# Patient Record
Sex: Female | Born: 1935 | Race: White | Hispanic: No | Smoking: Never smoker
Health system: Southern US, Community
[De-identification: ages and names within clinical notes are randomized; demographics above are authoritative.]

## PROBLEM LIST (undated history)

## (undated) DIAGNOSIS — I2510 Atherosclerotic heart disease of native coronary artery without angina pectoris: Secondary | ICD-10-CM

## (undated) DIAGNOSIS — I10 Essential (primary) hypertension: Secondary | ICD-10-CM

## (undated) DIAGNOSIS — G4730 Sleep apnea, unspecified: Secondary | ICD-10-CM

## (undated) DIAGNOSIS — I509 Heart failure, unspecified: Secondary | ICD-10-CM

---

## 2009-01-09 ENCOUNTER — Emergency Department (HOSPITAL_BASED_OUTPATIENT_CLINIC_OR_DEPARTMENT_OTHER): Admission: EM | Admit: 2009-01-09 | Discharge: 2009-01-09 | Payer: Self-pay | Admitting: Emergency Medicine

## 2014-09-11 ENCOUNTER — Emergency Department (HOSPITAL_BASED_OUTPATIENT_CLINIC_OR_DEPARTMENT_OTHER): Payer: Medicare Other

## 2014-09-11 ENCOUNTER — Emergency Department (HOSPITAL_BASED_OUTPATIENT_CLINIC_OR_DEPARTMENT_OTHER)
Admission: EM | Admit: 2014-09-11 | Discharge: 2014-09-11 | Disposition: A | Discharge status: Other Acute Inpatient Hospital | Payer: Medicare Other | Attending: Emergency Medicine | Admitting: Emergency Medicine

## 2014-09-11 ENCOUNTER — Encounter (HOSPITAL_BASED_OUTPATIENT_CLINIC_OR_DEPARTMENT_OTHER): Payer: Self-pay

## 2014-09-11 DIAGNOSIS — I10 Essential (primary) hypertension: Secondary | ICD-10-CM | POA: Insufficient documentation

## 2014-09-11 DIAGNOSIS — Z79899 Other long term (current) drug therapy: Secondary | ICD-10-CM | POA: Insufficient documentation

## 2014-09-11 DIAGNOSIS — I509 Heart failure, unspecified: Secondary | ICD-10-CM

## 2014-09-11 DIAGNOSIS — R0602 Shortness of breath: Secondary | ICD-10-CM | POA: Diagnosis present

## 2014-09-11 DIAGNOSIS — Z8669 Personal history of other diseases of the nervous system and sense organs: Secondary | ICD-10-CM | POA: Diagnosis not present

## 2014-09-11 DIAGNOSIS — I2510 Atherosclerotic heart disease of native coronary artery without angina pectoris: Secondary | ICD-10-CM | POA: Diagnosis not present

## 2014-09-11 HISTORY — DX: Heart failure, unspecified: I50.9

## 2014-09-11 HISTORY — DX: Sleep apnea, unspecified: G47.30

## 2014-09-11 HISTORY — DX: Essential (primary) hypertension: I10

## 2014-09-11 HISTORY — DX: Atherosclerotic heart disease of native coronary artery without angina pectoris: I25.10

## 2014-09-11 LAB — BASIC METABOLIC PANEL
Anion gap: 10 (ref 5–15)
BUN: 43 mg/dL — AB (ref 6–23)
CALCIUM: 9 mg/dL (ref 8.4–10.5)
CHLORIDE: 101 meq/L (ref 96–112)
CO2: 28 mmol/L (ref 19–32)
CREATININE: 1.27 mg/dL — AB (ref 0.50–1.10)
GFR calc Af Amer: 46 mL/min — ABNORMAL LOW (ref >90)
GFR, EST NON AFRICAN AMERICAN: 39 mL/min — AB (ref >90)
GLUCOSE: 129 mg/dL — AB (ref 70–99)
POTASSIUM: 4 mmol/L (ref 3.5–5.1)
Sodium: 139 mmol/L (ref 135–145)

## 2014-09-11 LAB — CBC
HEMATOCRIT: 37.9 % (ref 36.0–46.0)
HEMOGLOBIN: 11.7 g/dL — AB (ref 12.0–15.0)
MCH: 26.7 pg (ref 26.0–34.0)
MCHC: 30.9 g/dL (ref 30.0–36.0)
MCV: 86.3 fL (ref 78.0–100.0)
Platelets: 165 10*3/uL (ref 150–400)
RBC: 4.39 MIL/uL (ref 3.87–5.11)
RDW: 16.8 % — AB (ref 11.5–15.5)
WBC: 5.4 10*3/uL (ref 4.0–10.5)

## 2014-09-11 LAB — TROPONIN I: Troponin I: 0.03 ng/mL (ref <0.031)

## 2014-09-11 LAB — BRAIN NATRIURETIC PEPTIDE: B Natriuretic Peptide: 2523.4 pg/mL — ABNORMAL HIGH (ref 0.0–100.0)

## 2014-09-11 MED ORDER — HYDROCODONE-ACETAMINOPHEN 5-325 MG PO TABS
2.0000 | ORAL_TABLET | Freq: Once | ORAL | Status: AC
  Administered 2014-09-11: 2 via ORAL
  Filled 2014-09-11: qty 2

## 2014-09-11 MED ORDER — ALPRAZOLAM 0.5 MG PO TABS
0.5000 mg | ORAL_TABLET | Freq: Once | ORAL | Status: AC
  Administered 2014-09-11: 0.5 mg via ORAL
  Filled 2014-09-11: qty 1

## 2014-09-11 MED ORDER — FUROSEMIDE 10 MG/ML IJ SOLN
INTRAMUSCULAR | Status: DC
Start: 2014-09-11 — End: 2014-09-12
  Filled 2014-09-11: qty 4

## 2014-09-11 MED ORDER — FUROSEMIDE 10 MG/ML IJ SOLN
40.0000 mg | Freq: Once | INTRAMUSCULAR | Status: AC
  Administered 2014-09-11: 40 mg via INTRAVENOUS

## 2014-09-11 NOTE — ED Provider Notes (Signed)
CSN: [id number]     Arrival date & time 09/11/14  1628 History  This chart was scribe for Gilda Crease, * by Angelene Giovanni, ED Scribe. The patient was seen in room MH03/MH03 and the patient's care was started at 5:08 PM.    Chief Complaint  Patient presents with  . Shortness of Breath   The history is provided by the patient. No language interpreter was used.   HPI Comments: Briana Mason is a 81 y.o. female with a hx of CAD and congestive heart disease who presents to the Emergency Department for a chest xray. She explains that she had back pain and lower abdominal pain and itching so she went to [facility] to check if she had a UTI. She was referred here to check if she had fluid buildup and to check for signs of a heart failure.   Cardiologist: Dr Heron Nay [facility])  Past Medical History  Diagnosis Date  . Sleep apnea   . Hypertension   . Coronary artery disease   . Congestive heart disease    History reviewed. No pertinent past surgical history. No family history on file. History  Substance Use Topics  . Smoking status: Never Smoker   . Smokeless tobacco: Not on file  . Alcohol Use: Not on file   OB History    No data available     Review of Systems  Constitutional: Negative for fever.  Respiratory: Negative for chest tightness.   Cardiovascular: Negative for chest pain.  All other systems reviewed and are negative.     Allergies  Sulfa antibiotics and Tetracyclines & related  Home Medications   Prior to Admission medications   Medication Sig Start Date End Date Taking? Authorizing Provider  ALPRAZolam Prudy Feeler) 1 MG tablet Take 1 mg by mouth at bedtime as needed for anxiety.   Yes Historical Provider, MD  colesevelam (WELCHOL) 625 MG tablet Take 625 mg by mouth 2 (two) times daily with a meal.   Yes Historical Provider, MD  dexlansoprazole (DEXILANT) 60 MG capsule Take 60 mg by mouth daily.   Yes Historical Provider, MD  enalapril  (VASOTEC) 10 MG tablet Take 10 mg by mouth daily.   Yes Historical Provider, MD  ezetimibe (ZETIA) 10 MG tablet Take 10 mg by mouth daily.   Yes Historical Provider, MD  fenofibrate (TRICOR) 145 MG tablet Take 145 mg by mouth daily.   Yes Historical Provider, MD  FLUoxetine (PROZAC) 40 MG capsule Take 40 mg by mouth daily.   Yes Historical Provider, MD  furosemide (LASIX) 40 MG tablet Take 40 mg by mouth.  every other day,  every other day   Yes Historical Provider, MD  HYDROcodone-acetaminophen (NORCO/VICODIN) 5-325 MG per tablet Take 1 tablet by mouth every 4 (four) hours as needed for moderate pain.   Yes Historical Provider, MD  metoprolol (LOPRESSOR) 50 MG tablet Take 50 mg by mouth 2 (two) times daily.   Yes Historical Provider, MD   BP 141/70 mmHg  Pulse 81  Temp(Src) 99.1 F (37.3 C) (Oral)  Resp 25  Ht  (1.575 m)  Wt 155 lb (70.308 kg)  BMI 28.34 kg/m2  SpO2 91% Physical Exam  Constitutional: She is oriented to person, place, and time. She appears well-developed and well-nourished. No distress.  HENT:  Head: Normocephalic and atraumatic.  Right Ear: Hearing normal.  Left Ear: Hearing normal.  Nose: Nose normal.  Mouth/Throat: Oropharynx is clear and moist and mucous membranes are normal.  Eyes: Conjunctivae and EOM are normal. Pupils are equal, round, and reactive to light.  Neck: Normal range of motion. Neck supple.  Cardiovascular: Regular rhythm, S1 normal and S2 normal.  Exam reveals no gallop and no friction rub.   No murmur heard. Pulmonary/Chest: Effort normal. No respiratory distress. She has rales. She exhibits no tenderness.  Rales and crackling  Abdominal: Soft. Normal appearance and bowel sounds are normal. There is no hepatosplenomegaly. There is no tenderness. There is no rebound, no guarding, no tenderness at McBurney's point and negative Murphy's sign. No hernia.  Musculoskeletal: Normal range of motion. She exhibits edema.  Bilateral pitting  edema  Neurological: She is alert and oriented to person, place, and time. She has normal strength. No cranial nerve deficit or sensory deficit. Coordination normal. GCS eye subscore is 4. GCS verbal subscore is 5. GCS motor subscore is 6.  Skin: Skin is warm, dry and intact. No rash noted. No cyanosis.  Psychiatric: She has a normal mood and affect. Her speech is normal and behavior is normal. Thought content normal.  Nursing note and vitals reviewed.   ED Course  Procedures (including critical care time) DIAGNOSTIC STUDIES: Oxygen Saturation is 92% on [place], low by my interpretation.    COORDINATION OF CARE: 5:11 PM- Pt advised of plan for treatment and pt agrees.    Labs Review Labs Reviewed  CBC - Abnormal; Notable for the following:    Hemoglobin 11.7 (*)    RDW 16.8 (*)    All other components within normal limits  BASIC METABOLIC PANEL - Abnormal; Notable for the following:    Glucose, Bld 129 (*)    BUN 43 (*)    Creatinine, Ser 1.27 (*)    GFR calc non Af Amer 39 (*)    GFR calc Af Amer 46 (*)    All other components within normal limits  BRAIN NATRIURETIC PEPTIDE - Abnormal; Notable for the following:    B Natriuretic Peptide 2523.4 (*)    All other components within normal limits  TROPONIN I    Imaging Review Dg Chest Port 1 View  09/11/2014   CLINICAL DATA:  Patient is experiencing shortness of breath with low oxygen levels.  EXAM: PORTABLE CHEST - 1 VIEW  COMPARISON:  05/27/2014  FINDINGS: There is bilateral diffuse interstitial and alveolar airspace opacities. There is enlargement of the central pulmonary vasculature. There is no pleural effusion or pneumothorax. The heart size is enlarged. There is no acute osseous abnormality. There is osteoarthritis of bilateral glenohumeral joints.  IMPRESSION: Overall findings are most concerning for CHF.   Electronically Signed   By: Elige Ko   On: 09/11/2014 17:28     EKG Interpretation   Date/Time:  Saturday September 11 2014 16:43:48 EST Ventricular Rate:  67 PR Interval:  162 QRS Duration: 88 QT Interval:  420 QTC Calculation: 443 R Axis:   58 Text Interpretation:  Normal sinus rhythm Nonspecific ST abnormality  Abnormal ECG No previous tracing Confirmed by Nael Petrosyan  MD, Ambera Fedele  ([id number]) on 09/11/2014 5:06:50 PM      MDM   Final diagnoses:  None   CHF  Patient presents to the ER at the request of primary care physician. Patient presented to the primary care physician earlier today and was found to be profoundly hypoxic. She does have a history of congestive heart failure. She has noticed increased swelling of her leg starting yesterday. She has been taking extra Lasix but is still very short  of breath. Patient is continued to be hypoxic here in the ER requiring submental oxygen. Workup is consistent with decompensated congestive heart failure requiring hospitalization for further diuresis. [facility].  I personally performed the services described in this documentation, which was scribed in my presence. The recorded information has been reviewed and is accurate.    Gilda Creasehristopher J. Terrall Bley, MD 09/11/14 2101

## 2014-09-11 NOTE — ED Notes (Signed)
[facility] here for transport, pt remains alert, NAD, calm, interactive. "feels better".

## 2014-09-11 NOTE — ED Notes (Signed)
Patient here with increasing shortness of breath and low oxygen levels per patient, this am on awakening 85. On arrival to ED sats 73 on room air. Patient placed on 2L of oxygen as she wears at night. Reports that she noticed increased fluid in her ankles yesterday and took extra diuretic. Denies chest pain, no  Chest tightness.  Alert and oriented but appears slightly pale on arrival and lips bluish in color.

## 2014-09-11 NOTE — ED Notes (Signed)
Pt sitting on BSC, c/o chronic pain, requesting usual HS pain meds, EDP aware, orders received. Pt alert, NAD, calm, interactive, updated, family at [facility].

## 2014-09-11 NOTE — ED Notes (Signed)
[facility] Alert, NAD, calm, interactive, increased wob with exertion. [facility]

## 2016-01-24 IMAGING — CR DG CHEST 1V PORT
1 series · 1 of 1 positions shown · non-contrast
Comparison: 05/27/2014

CLINICAL DATA: Patient is experiencing shortness of breath with low
oxygen levels.

EXAM:
PORTABLE CHEST - 1 VIEW

[view not recorded]
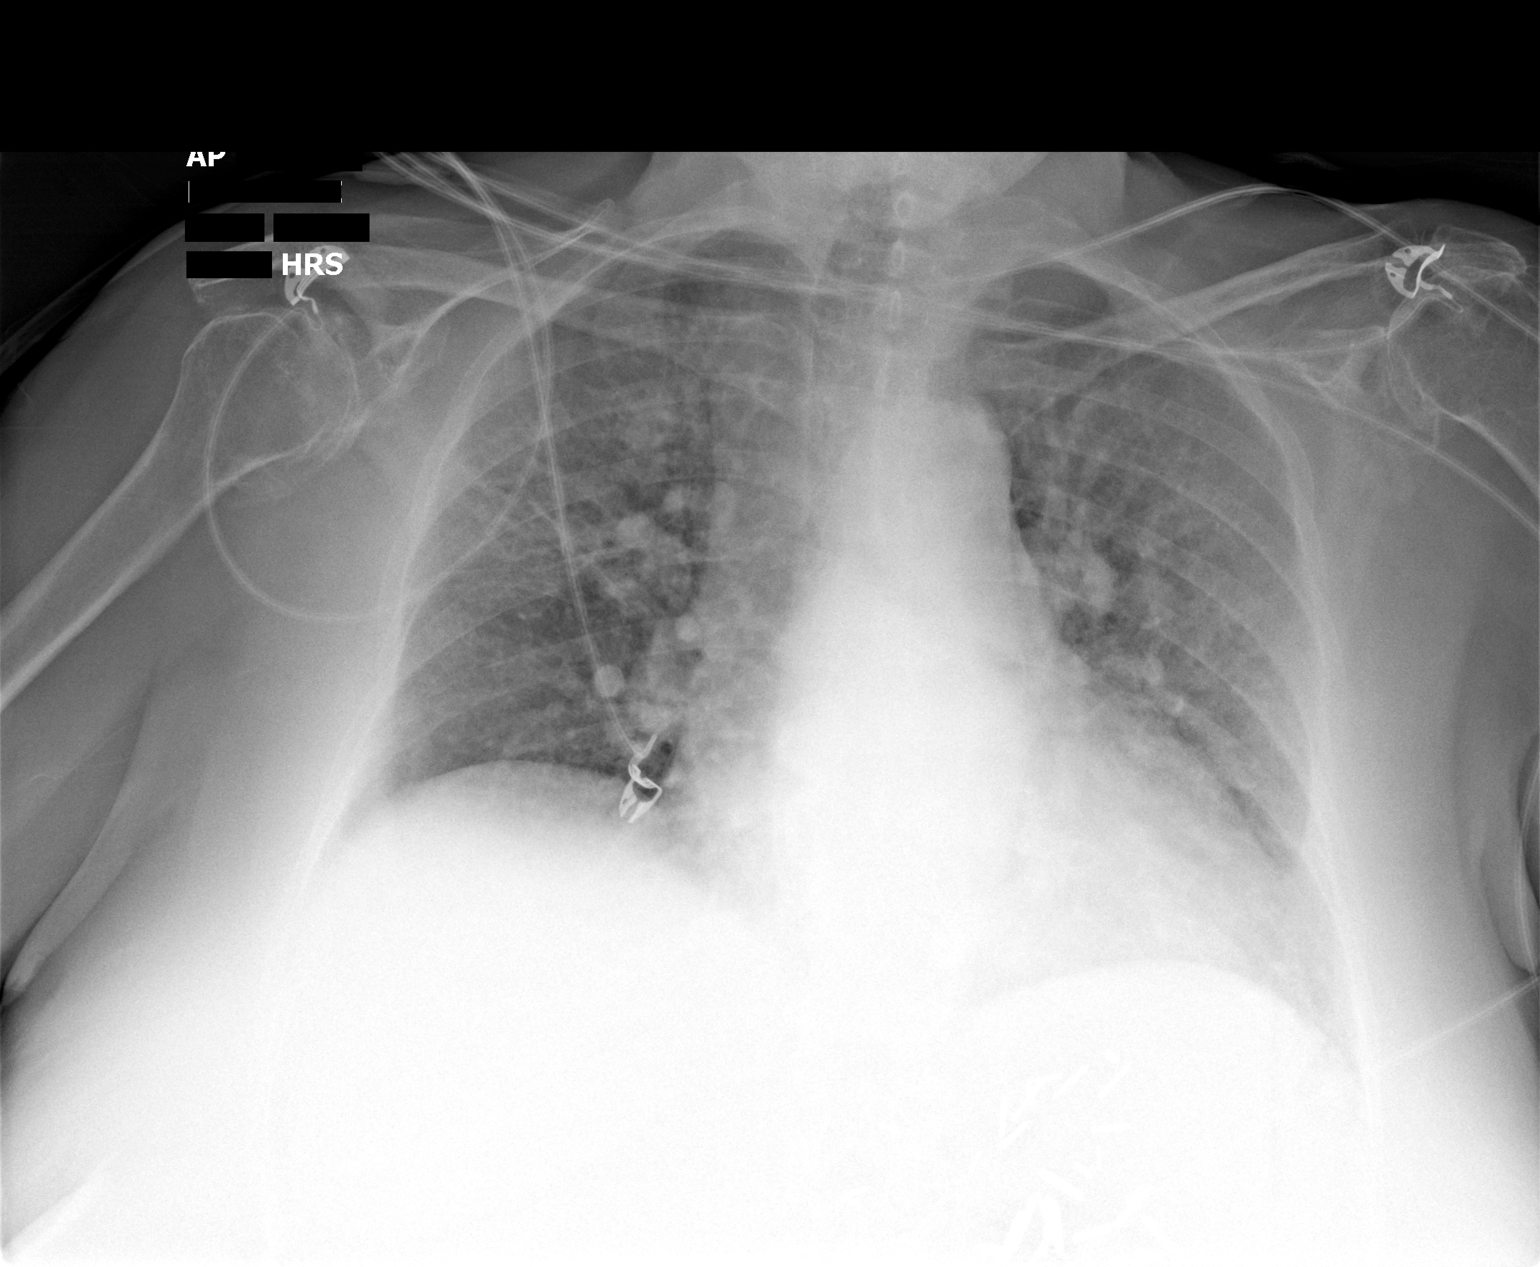

[1 of 1 positions shown; findings below may reference images not displayed]

FINDINGS: There is bilateral diffuse interstitial and alveolar airspace
opacities. There is enlargement of the central pulmonary
vasculature. There is no pleural effusion or pneumothorax. The heart
size is enlarged. There is no acute osseous abnormality. There is
osteoarthritis of bilateral glenohumeral joints.
IMPRESSION: Overall findings are most concerning for CHF.

## 2016-03-28 ENCOUNTER — Ambulatory Visit (HOSPITAL_COMMUNITY)
Admission: AD | Admit: 2016-03-28 | Discharge: 2016-03-28 | Disposition: A | Discharge status: Home / Self Care | Payer: Medicare Other | Source: Other Acute Inpatient Hospital | Attending: Internal Medicine | Admitting: Internal Medicine

## 2016-03-28 ENCOUNTER — Inpatient Hospital Stay
Admission: RE | Admit: 2016-03-28 | Discharge: 2016-04-25 | Disposition: A | Discharge status: Skilled Nursing Facility | Payer: Medicare Other | Source: Ambulatory Visit | Attending: Internal Medicine | Admitting: Internal Medicine

## 2016-03-28 ENCOUNTER — Other Ambulatory Visit (HOSPITAL_COMMUNITY): Payer: Self-pay

## 2016-03-28 DIAGNOSIS — Z4659 Encounter for fitting and adjustment of other gastrointestinal appliance and device: Secondary | ICD-10-CM

## 2016-03-28 DIAGNOSIS — Z9911 Dependence on respirator [ventilator] status: Secondary | ICD-10-CM | POA: Insufficient documentation

## 2016-03-28 DIAGNOSIS — J9690 Respiratory failure, unspecified, unspecified whether with hypoxia or hypercapnia: Secondary | ICD-10-CM

## 2016-03-28 DIAGNOSIS — J9600 Acute respiratory failure, unspecified whether with hypoxia or hypercapnia: Secondary | ICD-10-CM

## 2016-03-28 LAB — BLOOD GAS, ARTERIAL
Acid-Base Excess: 11.7 mmol/L — ABNORMAL HIGH (ref 0.0–2.0)
Bicarbonate: 36.1 mEq/L — ABNORMAL HIGH (ref 20.0–24.0)
FIO2: 0.4
LHR: 18 {breaths}/min
MECHVT: 380 mL
O2 Saturation: 99.8 %
PATIENT TEMPERATURE: 98.6
PCO2 ART: 50.7 mmHg — AB (ref 35.0–45.0)
PEEP/CPAP: 5 cmH2O
PO2 ART: 234 mmHg — AB (ref 80.0–100.0)
TCO2: 37.7 mmol/L (ref 0–100)
pH, Arterial: 7.466 — ABNORMAL HIGH (ref 7.350–7.450)

## 2016-03-29 ENCOUNTER — Other Ambulatory Visit (HOSPITAL_COMMUNITY): Payer: Self-pay

## 2016-03-29 DIAGNOSIS — J189 Pneumonia, unspecified organism: Secondary | ICD-10-CM | POA: Diagnosis not present

## 2016-03-29 DIAGNOSIS — J9601 Acute respiratory failure with hypoxia: Secondary | ICD-10-CM | POA: Diagnosis not present

## 2016-03-29 DIAGNOSIS — J810 Acute pulmonary edema: Secondary | ICD-10-CM

## 2016-03-29 DIAGNOSIS — J441 Chronic obstructive pulmonary disease with (acute) exacerbation: Secondary | ICD-10-CM | POA: Diagnosis not present

## 2016-03-29 LAB — BLOOD GAS, ARTERIAL
Acid-Base Excess: 10.7 mmol/L — ABNORMAL HIGH (ref 0.0–2.0)
Bicarbonate: 35.2 mEq/L — ABNORMAL HIGH (ref 20.0–24.0)
FIO2: 0.4
O2 SAT: 97.6 %
PCO2 ART: 51.6 mmHg — AB (ref 35.0–45.0)
PEEP: 5 cmH2O
PO2 ART: 98.3 mmHg (ref 80.0–100.0)
Patient temperature: 98.6
Pressure support: 5 cmH2O
TCO2: 36.8 mmol/L (ref 0–100)
pH, Arterial: 7.449 (ref 7.350–7.450)

## 2016-03-29 LAB — COMPREHENSIVE METABOLIC PANEL
ALBUMIN: 2.2 g/dL — AB (ref 3.5–5.0)
ALT: 19 U/L (ref 14–54)
AST: 31 U/L (ref 15–41)
Alkaline Phosphatase: 66 U/L (ref 38–126)
Anion gap: 9 (ref 5–15)
BUN: 29 mg/dL — AB (ref 6–20)
CHLORIDE: 102 mmol/L (ref 101–111)
CO2: 33 mmol/L — AB (ref 22–32)
CREATININE: 0.71 mg/dL (ref 0.44–1.00)
Calcium: 8 mg/dL — ABNORMAL LOW (ref 8.9–10.3)
GFR calc Af Amer: >60 mL/min (ref >60)
GFR calc non Af Amer: >60 mL/min (ref >60)
Glucose, Bld: 124 mg/dL — ABNORMAL HIGH (ref 65–99)
Potassium: 3.5 mmol/L (ref 3.5–5.1)
SODIUM: 144 mmol/L (ref 135–145)
Total Bilirubin: 0.7 mg/dL (ref 0.3–1.2)
Total Protein: 5.1 g/dL — ABNORMAL LOW (ref 6.5–8.1)

## 2016-03-29 LAB — PROTIME-INR
INR: 1.01
Prothrombin Time: 13.4 seconds (ref 11.4–15.2)

## 2016-03-29 LAB — CBC
HCT: 27.3 % — ABNORMAL LOW (ref 36.0–46.0)
Hemoglobin: 7.7 g/dL — ABNORMAL LOW (ref 12.0–15.0)
MCH: 25.7 pg — AB (ref 26.0–34.0)
MCHC: 28.2 g/dL — ABNORMAL LOW (ref 30.0–36.0)
MCV: 91 fL (ref 78.0–100.0)
PLATELETS: 152 10*3/uL (ref 150–400)
RBC: 3 MIL/uL — AB (ref 3.87–5.11)
RDW: 16.6 % — AB (ref 11.5–15.5)
WBC: 3.4 10*3/uL — ABNORMAL LOW (ref 4.0–10.5)

## 2016-03-29 NOTE — Consult Note (Signed)
Name: Marialis Kuder MRN: [id number] DOB: [birth year]    ADMISSION DATE:  03/28/2016 CONSULTATION DATE:  7/27  REFERRING MD :  Sharyon Medicus   CHIEF COMPLAINT:  Vent management   BRIEF PATIENT DESCRIPTION:  82yo female with hx chronic respiratory failure thought secondary to pulmonary HTN on 4L [place] at home.  Initially admitted 7/16 to [facility] with acute on chronic respiratory failure r/t CAP. She was initially tried on bipap but failed and was intubated 7/18.  She was treated with Abx and diuresis.  She improved and was extubated 7/24, but she had worsening hypoxia and required reintubation the same day.  Course c/b AKI, ARDS, severe sepsis and metabolic acidosis.   She was tx to [facility] 7/26 for further vent weaning.   SIGNIFICANT EVENTS    STUDIES:     HISTORY OF PRESENT ILLNESS:   82yo female with hx chronic respiratory failure thought secondary to pulmonary HTN on 4L [place] at home.  Initially admitted 7/16 to [facility] with acute on chronic respiratory failure r/t CAP. She was initially tried on bipap but failed and was intubated 7/18.  She was treated with Abx and diuresis.  She improved and was extubated 7/24, but she had worsening hypoxia and required reintubation the same day.  Course c/b AKI, severe sepsis and metabolic acidosis.   She is currently comfortable on PS 5/5.  Denies pain or SOB.    PAST MEDICAL HISTORY :   has a past medical history of Congestive heart disease; Coronary artery disease; Hypertension; and Sleep apnea.  has no past surgical history on file. Prior to Admission medications   Medication Sig Start Date End Date Taking? Authorizing Provider  ALPRAZolam Prudy Feeler) 1 MG tablet Take 1 mg by mouth at bedtime as needed for anxiety.    Historical Provider, MD  colesevelam (WELCHOL) 625 MG tablet Take 625 mg by mouth 2 (two) times daily with a meal.    Historical Provider, MD  dexlansoprazole (DEXILANT) 60 MG capsule Take 60 mg by mouth daily.     Historical Provider, MD  enalapril (VASOTEC) 10 MG tablet Take 10 mg by mouth daily.    Historical Provider, MD  ezetimibe (ZETIA) 10 MG tablet Take 10 mg by mouth daily.    Historical Provider, MD  fenofibrate (TRICOR) 145 MG tablet Take 145 mg by mouth daily.    Historical Provider, MD  FLUoxetine (PROZAC) 40 MG capsule Take 40 mg by mouth daily.    Historical Provider, MD  furosemide (LASIX) 40 MG tablet Take 40 mg by mouth. 40mg  every other day, 20mg  every other day    Historical Provider, MD  HYDROcodone-acetaminophen (NORCO/VICODIN) 5-325 MG per tablet Take 1 tablet by mouth every 4 (four) hours as needed for moderate pain.    Historical Provider, MD  metoprolol (LOPRESSOR) 50 MG tablet Take 50 mg by mouth 2 (two) times daily.    Historical Provider, MD   Allergies  Allergen Reactions  . Sulfa Antibiotics Rash  . Tetracyclines & Related Rash    FAMILY HISTORY:  family history is not on file. SOCIAL HISTORY:  reports that she has never smoked. She does not have any smokeless tobacco history on file.  REVIEW OF SYSTEMS:   As per HPI - All other systems reviewed and were neg.     SUBJECTIVE:    VITAL SIGNS: Reviewed at bedside.  Abnormal values addressed in Imp/Plan.   PHYSICAL EXAMINATION: General:  Chronically ill appearing female, NAD  Neuro:  Awake on vent, nods appropriately, follows commands, MAE  HEENT:  Mm moist, no JVD, ETT  Cardiovascular:  s1s2 rrr Lungs:  resps even non labored on PS 5/5, diminished throughout  Abdomen:  Round, soft, +bs  Musculoskeletal:  Warm and dry, scant BLE edema    Recent Labs Lab 03/29/16 0539  NA 144  K 3.5  CL 102  CO2 33*  BUN 29*  CREATININE 0.71  GLUCOSE 124*   No results for input(s): HGB, HCT, WBC, PLT in the last 168 hours. Dg Chest Port 1 View  Result Date: 03/29/2016 CLINICAL DATA:  Respiratory failure.  Sepsis.  Ventilator support. EXAM: PORTABLE CHEST 1 VIEW COMPARISON:  03/27/2016 FINDINGS: Endotracheal tube  has its tip 1 cm above the carina. Nasogastric tube enters stomach. Right internal jugular central line has its tip in the proximal right atrium. There is less pulmonary atelectasis. Diffuse interstitial density does persists consistent with mild edema. No effusions. IMPRESSION: Endotracheal tube 1 cm above the carina. Better pulmonary aeration. Mild persistent edema pattern. Electronically Signed   By: Paulina Fusi M.D.   On: 03/29/2016 07:23  Dg Abd Portable 1v  Result Date: 03/28/2016 CLINICAL DATA:  OG tube placement. EXAM: PORTABLE ABDOMEN - 1 VIEW COMPARISON:  None. FINDINGS: OG tube is looped in the distal stomach with the tip projecting retrograde in the distal body. IMPRESSION: As above. Electronically Signed   By: Drusilla Kanner M.D.   On: 03/28/2016 19:34   ASSESSMENT / PLAN:  Acute on chronic respiratory failure  CAP  Volume overload  Pulmonary HTN   REC -  Gentle diuresis  Can likely extubate  Pulmonary hygiene  F/u CXR  Supplemental O2 -- home 4L [place]  PRN BD's    Dirk Dress, NP 03/29/2016  10:42 AM Pager: [phone] or [phone]  Attending Note:  82 year old female with COPD, CHF and HCAP who transferred from outside hospital to [facility] int[facility]ated for further management.  Patient has been diuresed and is weaning very well.  On exam crackles noted.  I reviewed CXR myself, pulmonary edema noted.  Will continue diureses as renal function allows and will extubate after reviewing ABG from [facility].  In the meantime, continue abx treatment and pulmonary hygienes.  The patient is critically ill with multiple organ systems failure and requires high complexity decision making for assessment and support, frequent evaluation and titration of therapies, application of advanced monitoring technologies and extensive interpretation of multiple databases.   Critical Care Time devoted to patient care services described in this note is  35  Minutes. This time reflects time of care  of this signee Dr Koren Bound. This critical care time does not reflect procedure time, or teaching time or supervisory time of PA/NP/Med student/Med Resident etc but could involve care discussion time.  Alyson Reedy, M.D. [facility] Pulmonary/Critical Care Medicine. Pager: [phone]. After hours pager: [phone].

## 2016-03-30 LAB — C DIFFICILE QUICK SCREEN W PCR REFLEX
C DIFFICLE (CDIFF) ANTIGEN: NEGATIVE
C Diff interpretation: NOT DETECTED
C Diff toxin: NEGATIVE

## 2016-03-31 LAB — BLOOD GAS, ARTERIAL
Acid-Base Excess: 17.2 mmol/L — ABNORMAL HIGH (ref 0.0–2.0)
BICARBONATE: 42 meq/L — AB (ref 20.0–24.0)
FIO2: 0.36
O2 SAT: 94.1 %
PCO2 ART: 54.8 mmHg — AB (ref 35.0–45.0)
PH ART: 7.497 — AB (ref 7.350–7.450)
PO2 ART: 67 mmHg — AB (ref 80.0–100.0)
Patient temperature: 98.6
TCO2: 43.7 mmol/L (ref 0–100)

## 2016-04-02 ENCOUNTER — Other Ambulatory Visit (HOSPITAL_COMMUNITY): Payer: Self-pay

## 2016-04-02 LAB — BLOOD GAS, ARTERIAL
Acid-Base Excess: 19.9 mmol/L — ABNORMAL HIGH (ref 0.0–2.0)
Bicarbonate: 45 mEq/L — ABNORMAL HIGH (ref 20.0–24.0)
Delivery systems: POSITIVE
Expiratory PAP: 6
FIO2: 0.75
INSPIRATORY PAP: 18
Mode: POSITIVE
O2 SAT: 99.3 %
PATIENT TEMPERATURE: 98.6
PCO2 ART: 57.4 mmHg — AB (ref 35.0–45.0)
PH ART: 7.506 — AB (ref 7.350–7.450)
PO2 ART: 135 mmHg — AB (ref 80.0–100.0)
TCO2: 46.8 mmol/L (ref 0–100)

## 2016-04-03 ENCOUNTER — Other Ambulatory Visit (HOSPITAL_COMMUNITY): Payer: Self-pay

## 2016-04-03 LAB — BLOOD GAS, ARTERIAL
ACID-BASE EXCESS: 20.2 mmol/L — AB (ref 0.0–2.0)
ACID-BASE EXCESS: 18.2 mmol/L — AB (ref 0.0–2.0)
Bicarbonate: 45.7 mEq/L — ABNORMAL HIGH (ref 20.0–24.0)
Bicarbonate: 43.6 mEq/L — ABNORMAL HIGH (ref 20.0–24.0)
FIO2: 0.8
FIO2: 1
MECHVT: 380 mL
O2 Content: 40 L/min
O2 SAT: 90.8 %
O2 SAT: 99.9 %
PATIENT TEMPERATURE: 98.6
PCO2 ART: 65.2 mmHg — AB (ref 35.0–45.0)
PEEP/CPAP: 5 cmH2O
PH ART: 7.46 — AB (ref 7.350–7.450)
PH ART: 7.452 — AB (ref 7.350–7.450)
PO2 ART: 61.4 mmHg — AB (ref 80.0–100.0)
PO2 ART: 206 mmHg — AB (ref 80.0–100.0)
Patient temperature: 98.6
RATE: 23 resp/min
RATE: 18 resp/min
TCO2: 47.7 mmol/L (ref 0–100)
TCO2: 45.5 mmol/L (ref 0–100)
pCO2 arterial: 63.3 mmHg (ref 35.0–45.0)

## 2016-04-03 LAB — BASIC METABOLIC PANEL
Anion gap: 9 (ref 5–15)
BUN: 62 mg/dL — AB (ref 6–20)
CO2: 41 mmol/L — ABNORMAL HIGH (ref 22–32)
CREATININE: 1.01 mg/dL — AB (ref 0.44–1.00)
Calcium: 8.4 mg/dL — ABNORMAL LOW (ref 8.9–10.3)
Chloride: 94 mmol/L — ABNORMAL LOW (ref 101–111)
GFR calc Af Amer: 59 mL/min — ABNORMAL LOW (ref >60)
GFR, EST NON AFRICAN AMERICAN: 51 mL/min — AB (ref >60)
GLUCOSE: 135 mg/dL — AB (ref 65–99)
POTASSIUM: 4 mmol/L (ref 3.5–5.1)
SODIUM: 144 mmol/L (ref 135–145)

## 2016-04-05 DIAGNOSIS — Z4659 Encounter for fitting and adjustment of other gastrointestinal appliance and device: Secondary | ICD-10-CM

## 2016-04-05 DIAGNOSIS — J9600 Acute respiratory failure, unspecified whether with hypoxia or hypercapnia: Secondary | ICD-10-CM | POA: Diagnosis not present

## 2016-04-05 LAB — BASIC METABOLIC PANEL
ANION GAP: 8 (ref 5–15)
BUN: 62 mg/dL — AB (ref 6–20)
CHLORIDE: 92 mmol/L — AB (ref 101–111)
CO2: 43 mmol/L — AB (ref 22–32)
Calcium: 8.6 mg/dL — ABNORMAL LOW (ref 8.9–10.3)
Creatinine, Ser: 0.91 mg/dL (ref 0.44–1.00)
GFR calc Af Amer: >60 mL/min (ref >60)
GFR calc non Af Amer: 58 mL/min — ABNORMAL LOW (ref >60)
GLUCOSE: 138 mg/dL — AB (ref 65–99)
POTASSIUM: 3.3 mmol/L — AB (ref 3.5–5.1)
Sodium: 143 mmol/L (ref 135–145)

## 2016-04-05 NOTE — Consult Note (Signed)
Name: Briana Mason MRN: [id number] DOB: Jul 14, 1934    ADMISSION DATE:  03/28/2016 CONSULTATION DATE:  7/27  REFERRING MD :  Sharyon Medicus   CHIEF COMPLAINT:  Vent management   BRIEF PATIENT DESCRIPTION:   82 yo female with hx chronic respiratory failure thought secondary to pulmonary HTN on 4L [place] at home.  Initially admitted 7/16 to [facility] with acute on chronic respiratory failure r/t CAP. She was initially tried on bipap but failed and was intubated 7/18.  She was treated with Abx and diuresis.  She improved and was extubated 7/24, but she had worsening hypoxia and required reintubation the same day.  Course c/b AKI, ARDS, severe sepsis and metabolic acidosis.   She was tx to [facility] 7/26 for further vent weaning.   SIGNIFICANT EVENTS  PCCM saw in consult on 7/27: she passed [facility] eval and was successfully extubated Interval 7/27 to 8/1: progressive increase in shortness of breath; weaker, PO2 down to 61 on 100% BIPAP, CXR w/ increasing pulmonary edema-->intubated.  8/1 to 8/3: weaning O2 & peep. Continuing diuresis. PCCM asked to re-eval d/t on-going vent requirements.    STUDIES:  TTE at [place]: mod LVH, EF estimated >70%, mild to mod MR, severe PAH.   SUBJECTIVE:  Awake, c/o abd discomfort. Breathing w/out difficulty on full support.   VITAL SIGNS: 98.6 hr 89 rr 24 bp 100/51 sats 98% (.5)  Vent vt 380 rr 18 fio2 50% peep 5  PHYSICAL EXAMINATION: General:  Chronically ill appearing female, NAD on vent  Neuro:  Awake on vent, nods appropriately, follows commands, MAE  HEENT:  Mm moist, no JVD, ETT in place  Cardiovascular:  s1s2 rrr Lungs:  resps even non labored she has diffuse coarse breath sounds w/ crackles bilaterally  Abdomen:  Round, soft, +bs  Musculoskeletal:  Warm and dry, scant BLE edema    Recent Labs Lab 04/03/16 1133 04/05/16 0714  NA 144 143  K 4.0 3.3*  CL 94* 92*  CO2 41* 43*  BUN 62* 62*  CREATININE 1.01* 0.91  GLUCOSE 135* 138*     Recent Labs Lab 03/29/16 1149  HGB 7.7*  HCT 27.3*  WBC 3.4*  PLT 152   Dg Chest Port 1 View  Result Date: 04/03/2016 CLINICAL DATA:  Respiratory failure.  Endotracheal tube position EXAM: PORTABLE CHEST 1 VIEW COMPARISON:  03/29/2016 FINDINGS: Endotracheal tube is lower, now at the level of the carina. Recommend withdrawal 3 cm. Right jugular central venous catheter tip at the cavoatrial junction unchanged. Progressive volume loss the right lung base with elevated right hemidiaphragm. Progressive left lower lobe atelectasis. Vascular congestion has progressed.  Probable pulmonary edema. Small right pleural effusion has progressed. IMPRESSION: Endotracheal tube at the carina.  Recommend withdrawal 3 cm Progressive bibasilar atelectasis Progression of bilateral airspace disease most likely pulmonary edema. Electronically Signed   By: Marlan Palau M.D.   On: 04/03/2016 13:05    ASSESSMENT / PLAN: Acute on chronic hypoxic respiratory failure OSA (intolerant of CPAP) Acute on Chronic diastolic HF Cor Pulmonale  Severe Secondary PAH AS (moderate) Severe deconditioning.   Discussion Acute on chronic respiratory failure in setting of decompensated  Diastolic HF, OSA and cor pulmonale d/t severe underlying PAH w/ eventual progressive volume overload and respiratory failure.  She is followed by Pulmonary at [facility] & carries h/o chronic resp failure, OSA, PAH and diastolic dysfxn. The Pulmonary hypertension is secondary PAH and felt d/t her underlying OSA and diastolic dysfxn. She is frail at  baseline and the goal was to treat underlying contributing causes rather than add pulmonary vasodilators. She has been intolerant of CPAP in the past & refuses this as long term. They were titrating nocturnal oxygen. I am concerned that given the severity of her PAH and underlying OSA that she will improve to point that she is extubated only to fail again slowly as she appears to have demonstrated since her  admit to [place].   Plan Cont current diuretics-->aim for negative volume status Wean FIO2/peep Repeat CXR and abg in am 8/4 Prior to extubation we need to decide on goals of care. She has been intolerant of CPAP and refused it per her [facility] notes. If this is still the case then probably the best option is trach vs extubate and transition to comfort next time she decompensates.   Simonne Martinet ACNP-BC [facility] Pulmonary/Critical Care Pager # [phone] OR # [phone] if no answer  STAFF NOTE: I, Rory Percy, MD FACP have personally reviewed patient's available data, including medical history, events of note, physical examination and test results as part of my evaluation. I have discussed with resident/NP and other care providers such as pharmacist, RN and RRT. In addition, I personally evaluated patient and elicited key findings of: ETT, awake, follows commands, ronchi, murmur, JVD, has sig PA htn likely a huge contributer to reoccurent resp failure, ETT may need advance, pt does not want trach or long term support given her underlying co morbids, lasix to to daily neg baalnce is major treatment that may improve weaning and extubation success, follow renal fxn closely, I updated the pt and son in room extensive, upright, wean PS 5 cpap 5 The patient is critically ill with multiple organ systems failure and requires high complexity decision making for assessment and support, frequent evaluation and titration of therapies, application of advanced monitoring technologies and extensive interpretation of multiple databases.   Critical Care Time devoted to patient care services described in this note is 30 Minutes. This time reflects time of care of this signee: Rory Percy, MD FACP. This critical care time does not reflect procedure time, or teaching time or supervisory time of PA/NP/Med student/Med Resident etc but could involve care discussion time. Rest per NP/medical resident whose note  is outlined above and that I agree with   Mcarthur Rossetti. Tyson Alias, MD, FACP Pgr: [phone] [place] [facility] & Critical [facility] 04/05/2016 12:39 PM

## 2016-04-06 ENCOUNTER — Institutional Professional Consult (permissible substitution) (HOSPITAL_COMMUNITY): Payer: Self-pay

## 2016-04-06 LAB — CBC
HEMATOCRIT: 24.8 % — AB (ref 36.0–46.0)
HEMOGLOBIN: 7 g/dL — AB (ref 12.0–15.0)
MCH: 25.5 pg — ABNORMAL LOW (ref 26.0–34.0)
MCHC: 28.2 g/dL — ABNORMAL LOW (ref 30.0–36.0)
MCV: 90.2 fL (ref 78.0–100.0)
Platelets: 229 10*3/uL (ref 150–400)
RBC: 2.75 MIL/uL — AB (ref 3.87–5.11)
RDW: 15.9 % — ABNORMAL HIGH (ref 11.5–15.5)
WBC: 5.3 10*3/uL (ref 4.0–10.5)

## 2016-04-06 LAB — PREPARE RBC (CROSSMATCH)

## 2016-04-06 LAB — ABO/RH: ABO/RH(D): A POS

## 2016-04-07 LAB — BASIC METABOLIC PANEL
Anion gap: 12 (ref 5–15)
BUN: 65 mg/dL — AB (ref 6–20)
CHLORIDE: 92 mmol/L — AB (ref 101–111)
CO2: 40 mmol/L — AB (ref 22–32)
CREATININE: 0.99 mg/dL (ref 0.44–1.00)
Calcium: 8.6 mg/dL — ABNORMAL LOW (ref 8.9–10.3)
GFR calc Af Amer: >60 mL/min (ref >60)
GFR calc non Af Amer: 52 mL/min — ABNORMAL LOW (ref >60)
GLUCOSE: 162 mg/dL — AB (ref 65–99)
POTASSIUM: 3.3 mmol/L — AB (ref 3.5–5.1)
Sodium: 144 mmol/L (ref 135–145)

## 2016-04-07 LAB — CBC
HCT: 29.1 % — ABNORMAL LOW (ref 36.0–46.0)
Hemoglobin: 8.4 g/dL — ABNORMAL LOW (ref 12.0–15.0)
MCH: 25.8 pg — AB (ref 26.0–34.0)
MCHC: 28.9 g/dL — ABNORMAL LOW (ref 30.0–36.0)
MCV: 89.3 fL (ref 78.0–100.0)
PLATELETS: 278 10*3/uL (ref 150–400)
RBC: 3.26 MIL/uL — AB (ref 3.87–5.11)
RDW: 15.8 % — ABNORMAL HIGH (ref 11.5–15.5)
WBC: 9.4 10*3/uL (ref 4.0–10.5)

## 2016-04-07 LAB — TYPE AND SCREEN
ABO/RH(D): A POS
ANTIBODY SCREEN: NEGATIVE
UNIT DIVISION: 0

## 2016-04-08 LAB — BASIC METABOLIC PANEL
ANION GAP: 11 (ref 5–15)
BUN: 85 mg/dL — ABNORMAL HIGH (ref 6–20)
CALCIUM: 8.3 mg/dL — AB (ref 8.9–10.3)
CO2: 41 mmol/L — AB (ref 22–32)
CREATININE: 1.21 mg/dL — AB (ref 0.44–1.00)
Chloride: 92 mmol/L — ABNORMAL LOW (ref 101–111)
GFR calc non Af Amer: 41 mL/min — ABNORMAL LOW (ref >60)
GFR, EST AFRICAN AMERICAN: 48 mL/min — AB (ref >60)
GLUCOSE: 154 mg/dL — AB (ref 65–99)
POTASSIUM: 3.5 mmol/L (ref 3.5–5.1)
SODIUM: 144 mmol/L (ref 135–145)

## 2016-04-10 DIAGNOSIS — J9601 Acute respiratory failure with hypoxia: Secondary | ICD-10-CM | POA: Diagnosis not present

## 2016-04-10 DIAGNOSIS — I272 Other secondary pulmonary hypertension: Secondary | ICD-10-CM

## 2016-04-10 NOTE — Progress Notes (Signed)
Name: Briana Mason MRN: [id number] DOB: [birth year]    ADMISSION DATE:  03/28/2016 CONSULTATION DATE:  7/27  REFERRING MD :  Sharyon MedicusHijazi   CHIEF COMPLAINT:  Vent management   BRIEF PATIENT DESCRIPTION:   82 y/o female with hx chronic respiratory failure thought secondary to pulmonary HTN on 4L [place] at home.  Initially admitted 7/16 to [facility] with acute on chronic respiratory failure r/t CAP. She was initially tried on bipap but failed and was intubated 7/18.  She was treated with Abx and diuresis.  She improved and was extubated 7/24, but she had worsening hypoxia and required reintubation the same day.  Course c/b AKI, ARDS, severe sepsis and metabolic acidosis.   She was tx to [facility] 7/26 for further vent weaning.   SIGNIFICANT EVENTS  727  PCCM saw in consult, she passed SBT eval and was successfully extubated 727 - 8/1 progressive increase in shortness of breath; weaker, PO2 down to 61 on 100% BIPAP, CXR w/ increasing pulmonary edema-->intubated.  8/1 to 8/3: weaning O2 & peep. Continuing diuresis. PCCM asked to re-eval d/t on-going vent requirements.    STUDIES:  [place] mod LVH, EF estimated >70%, mild to mod MR, severe PAH.   SUBJECTIVE:  Pt denies pain, SOB.  Nods yes to sore throat / uncomfortable on vent.  RT reports pt weaning on PSV 12/5, 40% with goal of 20 hours  VITAL SIGNS: 99.7, 97, 111/53, 90%  VENTILATOR:  PSV 12/5, 40%  PHYSICAL EXAMINATION: General:  Chronically ill appearing female, NAD on vent  Neuro:  Awake on vent, nods appropriately, follows commands, MAE  HEENT:  MM moist, no JVD, ETT in place  Cardiovascular:  s1s2 rrr Lungs:  resps even / non-labored she has diffusely coarse breath sounds Abdomen:  Round, soft, +bs  Musculoskeletal:  Warm and dry, scant BLE edema    Recent Labs Lab 04/05/16 0714 04/07/16 0731 04/08/16 0637  NA 143 144 144  K 3.3* 3.3* 3.5  CL 92* 92* 92*  CO2 43* 40* 41*  BUN 62* 65* 85*  CREATININE 0.91  0.99 1.21*  GLUCOSE 138* 162* 154*    Recent Labs Lab 04/06/16 0555 04/07/16 0731  HGB 7.0* 8.4*  HCT 24.8* 29.1*  WBC 5.3 9.4  PLT 229 278   No results found.  ASSESSMENT / PLAN: Acute on chronic hypoxic respiratory failure OSA (intolerant of CPAP) Acute on Chronic diastolic HF Cor Pulmonale  Severe Secondary PAH AS (moderate) Severe deconditioning.   Discussion Acute on chronic respiratory failure in setting of decompensated  Diastolic HF, OSA and cor pulmonale d/t severe underlying PAH w/ eventual progressive volume overload and respiratory failure.  She is [facility] Pulmonary [facility] & carries h/o chronic resp failure, OSA, PAH and diastolic dysfxn. The Pulmonary hypertension is secondary PAH and felt d/t her underlying OSA and diastolic dysfxn. She is frail at baseline and the goal was to treat underlying contributing causes rather than add pulmonary vasodilators. She has been intolerant of CPAP in the past & refuses this as long term. They were titrating nocturnal oxygen. Concerned that given the severity of her PAH and underlying OSA that she will improve to point that she is extubated only to fail again slowly as she appears to have demonstrated since [place].   Plan Cont current diuretics-->aim for negative volume status Wean FIO2/peep as tolerated Trend intermittent CXR Vent wean protocol Push PT efforts - mobilize, OOB  Prior discussion  re: goals of care. She has been intolerant of CPAP and refused it per her [facility] notes. Plan for optimization with one-way extubation and bipap compliance.  Family would not want trach or prolonged support.    Canary Brim, NP-C [facility] Pgr: [phone] or if no answer [phone] 04/10/2016, 11:21 AM

## 2016-04-11 ENCOUNTER — Other Ambulatory Visit (HOSPITAL_COMMUNITY): Payer: Self-pay

## 2016-04-11 DIAGNOSIS — J9601 Acute respiratory failure with hypoxia: Secondary | ICD-10-CM | POA: Diagnosis not present

## 2016-04-11 DIAGNOSIS — I272 Other secondary pulmonary hypertension: Secondary | ICD-10-CM | POA: Diagnosis not present

## 2016-04-11 LAB — CBC
HEMATOCRIT: 29.2 % — AB (ref 36.0–46.0)
HEMOGLOBIN: 8.8 g/dL — AB (ref 12.0–15.0)
MCH: 26 pg (ref 26.0–34.0)
MCHC: 30.1 g/dL (ref 30.0–36.0)
MCV: 86.1 fL (ref 78.0–100.0)
Platelets: 325 10*3/uL (ref 150–400)
RBC: 3.39 MIL/uL — AB (ref 3.87–5.11)
RDW: 15.8 % — ABNORMAL HIGH (ref 11.5–15.5)
WBC: 9.1 10*3/uL (ref 4.0–10.5)

## 2016-04-11 LAB — BLOOD GAS, ARTERIAL
ACID-BASE EXCESS: 14.4 mmol/L — AB (ref 0.0–2.0)
BICARBONATE: 39 meq/L — AB (ref 20.0–24.0)
FIO2: 45
O2 SAT: 94.9 %
PATIENT TEMPERATURE: 97.7
PCO2 ART: 51.5 mmHg — AB (ref 35.0–45.0)
PEEP: 5 cmH2O
PH ART: 7.489 — AB (ref 7.350–7.450)
PRESSURE SUPPORT: 5 cmH2O
TCO2: 40.6 mmol/L (ref 0–100)
pO2, Arterial: 70 mmHg — ABNORMAL LOW (ref 80.0–100.0)

## 2016-04-11 LAB — BASIC METABOLIC PANEL
ANION GAP: 14 (ref 5–15)
BUN: 132 mg/dL — ABNORMAL HIGH (ref 6–20)
CALCIUM: 8.4 mg/dL — AB (ref 8.9–10.3)
CHLORIDE: 91 mmol/L — AB (ref 101–111)
CO2: 37 mmol/L — AB (ref 22–32)
Creatinine, Ser: 1.55 mg/dL — ABNORMAL HIGH (ref 0.44–1.00)
GFR calc non Af Amer: 30 mL/min — ABNORMAL LOW (ref >60)
GFR, EST AFRICAN AMERICAN: 35 mL/min — AB (ref >60)
GLUCOSE: 129 mg/dL — AB (ref 65–99)
POTASSIUM: 3.9 mmol/L (ref 3.5–5.1)
Sodium: 142 mmol/L (ref 135–145)

## 2016-04-11 NOTE — Progress Notes (Signed)
Name: Briana Mason MRN: [id number] DOB: 01/04/1934    ADMISSION DATE:  03/28/2016 CONSULTATION DATE:  7/27  REFERRING MD :  Sharyon Medicus   CHIEF COMPLAINT:  Vent management   BRIEF PATIENT DESCRIPTION:   82 y/o female with hx chronic respiratory failure thought secondary to pulmonary HTN on 4L [place] at home.  Initially admitted 7/16 to [facility] with acute on chronic respiratory failure r/t CAP. She was initially tried on bipap but failed and was intubated 7/18.  She was treated with Abx and diuresis.  She improved and was extubated 7/24, but she had worsening hypoxia and required reintubation the same day.  Course c/b AKI, ARDS, severe sepsis and metabolic acidosis.   She was tx to [facility]/26 for further vent weaning.   SIGNIFICANT EVENTS  727  PCCM saw in consult, she passed SBT eval and was successfully extubated 727 - 8/1 progressive increase in shortness of breath; weaker, PO2 down to 61 on 100% BIPAP, CXR w/ increasing pulmonary edema-->intubated.  8/1 to 8/3: weaning O2 & peep. Continuing diuresis. PCCM asked to re-eval d/t on-going vent requirements.   8/09  Weaning trial   STUDIES:  [place]: mod LVH, EF estimated >70%, mild to mod MR, severe PAH.   SUBJECTIVE:  RT reports concerns for increased secretions.  RN notes feeding tube is clogged / unable to flush.   VITAL SIGNS: 100.2, 95, 101/53, 95%  VENTILATOR:  To PSV 5/5  PHYSICAL EXAMINATION: General:  Chronically ill appearing female, NAD on vent  Neuro:  Awake on vent, nods appropriately, follows commands, MAE  HEENT:  MM moist, no JVD, ETT in place  Cardiovascular:  s1s2 rrr Lungs:  resps even / non-labored, diffusely coarse breath sounds, creamy yellow secretions from ETT Abdomen:  Round, soft, +bs  Musculoskeletal:  Warm and dry, scant BLE edema    Recent Labs Lab 04/07/16 0731 04/08/16 0637 04/11/16 0629  NA 144 144 142  K 3.3* 3.5 3.9  CL 92* 92* 91*  CO2 40* 41* 37*  BUN 65* 85* 132*    CREATININE 0.99 1.21* 1.55*  GLUCOSE 162* 154* 129*    Recent Labs Lab 04/06/16 0555 04/07/16 0731 04/11/16 0629  HGB 7.0* 8.4* 8.8*  HCT 24.8* 29.1* 29.2*  WBC 5.3 9.4 9.1  PLT 229 278 325   Dg Chest Port 1 View  Result Date: 04/11/2016 CLINICAL DATA:  Respiratory failure. EXAM: PORTABLE CHEST 1 VIEW COMPARISON:  04/06/2016. FINDINGS: Endotracheal tube, NG tube in stable position. Interim removal right IJ line. Stable cardiomegaly. Progressive right base atelectasis and/or consolidation . Interim improvement of bilateral pulmonary infiltrates and or edema with persistent interstitial prominence. No prominent pleural effusion or pneumothorax. IMPRESSION: 1. Intimal removal of right IJ line. Endotracheal tube and NG tube in stable position. 2.  Progressive right lower lobe atelectasis and/or consolidation. 3. Interim improvement of bilateral pulmonary inter infiltrates/edema. Persistent interstitial prominence remains. Electronically Signed   By: Maisie Fus  Register   On: 04/11/2016 07:31    ASSESSMENT / PLAN: Acute on chronic hypoxic respiratory failure OSA (intolerant of CPAP) Acute on Chronic diastolic HF Cor Pulmonale  Severe Secondary PAH AS (moderate) Severe deconditioning.   Discussion Acute on chronic respiratory failure in setting of decompensated  Diastolic HF, OSA and cor pulmonale d/t severe underlying PAH w/ eventual progressive volume overload and respiratory failure.  She is followed by Pulmonary at [facility] & carries h/o chronic resp failure, OSA, PAH and diastolic dysfxn. The Pulmonary hypertension is  secondary PAH and felt d/t her underlying OSA and diastolic dysfxn. She is frail at baseline and the goal was to treat underlying contributing causes rather than add pulmonary vasodilators. She has been intolerant of CPAP in the past & refuses this as long term. They were titrating nocturnal oxygen. Concerned that given the severity of her PAH and underlying OSA that she will  improve to point that she is extubated only to fail again slowly as she appears to have demonstrated since [place].   Plan Cont current diuretics-->aim for negative volume status Wean FIO2/peep as tolerated PSV 5/5  Assess ABG 30 minutes after extubation Instructions given for when to abort wean and return to full support if distress Trend intermittent CXR Vent wean protocol Push PT efforts - mobilize, OOB  Prior discussion re: goals of care. She has been intolerant of CPAP and refused it per her [facility] notes. Plan for optimization with one-way extubation and bipap compliance.  Prior discussion with "HCPOA" was apparently the wrong person to speak to and code status was reversed per HCPOA to full code.     Canary BrimBrandi Jarika Robben, NP-C [facility] Pgr: [phone] or if no answer [phone] 04/11/2016, 8:36 AM

## 2016-04-11 NOTE — Progress Notes (Signed)
Patient assessed at bedside for extubation.  Weaned on PSV 5/5, 45% and tolerated well.  ABG on wean 7.49 / 51 / 70 / 95%.  Pulling Vt 300-500 on wean, respiratory rate 20.  No evidence of distress.  Patient able to follow commands and meets extubation criteria.     Plan: Now extubation  PRN bipap during day for increased WOB, may need to cycle on / off to support respiratory muscles if distress  Encouraged deep breathing / coughing with patient  Incentive spirometry Upright positioning  NGT is clogged > RN unable to flush, recommend d/c  NPO x4 hours, then SLP eval for swallowing needs.   Canary BrimBrandi Edgar Reisz, NP-C [facility] Pgr: [phone] or if no answer [phone] 04/11/2016, 10:44 AM

## 2016-04-12 ENCOUNTER — Other Ambulatory Visit (HOSPITAL_COMMUNITY): Payer: Self-pay

## 2016-04-12 DIAGNOSIS — J9601 Acute respiratory failure with hypoxia: Secondary | ICD-10-CM | POA: Diagnosis not present

## 2016-04-12 DIAGNOSIS — I272 Other secondary pulmonary hypertension: Secondary | ICD-10-CM | POA: Diagnosis not present

## 2016-04-12 NOTE — Progress Notes (Signed)
Name: Briana NeighborsFern Mason MRN: [id number] DOB: August 20, 1934    ADMISSION DATE:  03/28/2016 CONSULTATION DATE:  7/27  REFERRING MD :  Sharyon MedicusHijazi   CHIEF COMPLAINT:  Vent management   BRIEF PATIENT DESCRIPTION:   82 y/o female with hx chronic respiratory failure thought secondary to pulmonary HTN on 4L [place] at home.  Initially admitted 7/16 to [facility] with acute on chronic respiratory failure r/t CAP. She was initially tried on bipap but failed and was intubated 7/18.  She was treated with Abx and diuresis.  She improved and was extubated 7/24, but she had worsening hypoxia and required reintubation the same day.  Course c/b AKI, ARDS, severe sepsis and metabolic acidosis.   She was tx to [facility]/26 for further vent weaning.   SIGNIFICANT EVENTS  727  PCCM saw in consult, she passed SBT eval and was successfully extubated 727 - 8/1 progressive increase in shortness of breath; weaker, PO2 down to 61 on 100% BIPAP, CXR w/ increasing pulmonary edema-->intubated.  8/1 to 8/3: weaning O2 & peep. Continuing diuresis. PCCM asked to re-eval d/t on-going vent requirements.   8/09  Weaning trial   STUDIES:  [place]: mod LVH, EF estimated >70%, mild to mod MR, severe PAH.   SUBJECTIVE:  Extubated 8/9 ,has done well  On 5 L oxygen Denies chest pain or dyspnea NG tube inserted   VITAL SIGNS: Afebrile, 95, 101/53, 95%   PHYSICAL EXAMINATION: General:  Chronically ill appearing female, NAD  Neuro:  Awake on vent, nods appropriately, follows commands, MAE  HEENT:  MM moist, no JVD Cardiovascular:  s1s2 rrr Lungs:  resps even / non-labored, diffusely coarse breath sounds Abdomen:  Round, soft, +bs  Musculoskeletal:  Warm and dry, scant BLE edema    Recent Labs Lab 04/07/16 0731 04/08/16 0637 04/11/16 0629  NA 144 144 142  K 3.3* 3.5 3.9  CL 92* 92* 91*  CO2 40* 41* 37*  BUN 65* 85* 132*  CREATININE 0.99 1.21* 1.55*  GLUCOSE 162* 154* 129*    Recent Labs Lab  04/06/16 0555 04/07/16 0731 04/11/16 0629  HGB 7.0* 8.4* 8.8*  HCT 24.8* 29.1* 29.2*  WBC 5.3 9.4 9.1  PLT 229 278 325   Dg Chest Port 1 View  Result Date: 04/12/2016 CLINICAL DATA:  Respiratory failure. EXAM: PORTABLE CHEST 1 VIEW COMPARISON:  04/06/2016, 04/03/2016, 03/27/2016, in mild 03/18/2016 and 06/19/2015 FINDINGS: Endotracheal tube has been removed. NG tube is below the diaphragm. Interstitial accentuation is present in the mid and lower lung zones bilaterally, slightly improved on the right, with slight atelectasis at the lung bases. No lung consolidation. Heart size and vascularity are normal. IMPRESSION: Slight improvement of the faint interstitial infiltrates on the right. Minimal stable bibasilar atelectasis. Electronically Signed   By: Francene BoyersJames  Maxwell M.D.   On: 04/12/2016 07:45   Dg Chest Port 1 View  Result Date: 04/11/2016 CLINICAL DATA:  Respiratory failure. EXAM: PORTABLE CHEST 1 VIEW COMPARISON:  04/06/2016. FINDINGS: Endotracheal tube, NG tube in stable position. Interim removal right IJ line. Stable cardiomegaly. Progressive right base atelectasis and/or consolidation . Interim improvement of bilateral pulmonary infiltrates and or edema with persistent interstitial prominence. No prominent pleural effusion or pneumothorax. IMPRESSION: 1. Intimal removal of right IJ line. Endotracheal tube and NG tube in stable position. 2.  Progressive right lower lobe atelectasis and/or consolidation. 3. Interim improvement of bilateral pulmonary inter infiltrates/edema. Persistent interstitial prominence remains. Electronically Signed   By: Maisie Fushomas  Register  On: 04/11/2016 07:31   Dg Abd Portable 1v  Result Date: 04/11/2016 CLINICAL DATA:  Check nasogastric catheter placement EXAM: PORTABLE ABDOMEN - 1 VIEW COMPARISON:  03/29/2016 FINDINGS: Nasogastric catheter is again noted in the midportion of the stomach. Postsurgical changes are seen. No obstructive changes are noted. Bony structures  are stable. IMPRESSION: Nasogastric catheter within the stomach. Electronically Signed   By: Alcide Clever M.D.   On: 04/11/2016 19:21    ASSESSMENT / PLAN: Acute on chronic hypoxic respiratory failure OSA (intolerant of CPAP) Acute on Chronic diastolic HF Cor Pulmonale  Severe Secondary PAH AS (moderate) Severe deconditioning.   Discussion Acute on chronic respiratory failure in setting of decompensated  Diastolic HF, OSA and cor pulmonale d/t severe underlying PAH w/ eventual progressive volume overload and respiratory failure.  She is followed by Pulmonary at [facility] & carries h/o chronic resp failure, OSA, PAH and diastolic dysfxn. The Pulmonary hypertension is secondary PAH and felt d/t her underlying OSA and diastolic dysfxn. She is frail at baseline and the goal was to treat underlying contributing causes rather than add pulmonary vasodilators. She has been intolerant of CPAP in the past & refuses this as long term. They were titrating nocturnal oxygen. Concerned that given the severity of her PAH and underlying OSA that she will improve to point that she is extubated only to fail again slowly as she appears to have demonstrated since [place].   Plan Suggest back off on diuresis, since BUN raising Push PT efforts - mobilize, OOB  Suggest discussion on goals of care - Prior discussion with "HCPOA" was apparently the wrong person to speak to and code status was reversed per HCPOA to full code.    PCCM available as needed  Oretha Milch. MD 04/12/2016, 1:00 PM

## 2016-04-13 ENCOUNTER — Other Ambulatory Visit (HOSPITAL_COMMUNITY): Payer: Self-pay

## 2016-04-13 LAB — BASIC METABOLIC PANEL
ANION GAP: 11 (ref 5–15)
BUN: 135 mg/dL — ABNORMAL HIGH (ref 6–20)
CALCIUM: 8.6 mg/dL — AB (ref 8.9–10.3)
CO2: 37 mmol/L — ABNORMAL HIGH (ref 22–32)
CREATININE: 1.14 mg/dL — AB (ref 0.44–1.00)
Chloride: 98 mmol/L — ABNORMAL LOW (ref 101–111)
GFR calc Af Amer: 51 mL/min — ABNORMAL LOW (ref >60)
GFR, EST NON AFRICAN AMERICAN: 44 mL/min — AB (ref >60)
GLUCOSE: 163 mg/dL — AB (ref 65–99)
Potassium: 3.4 mmol/L — ABNORMAL LOW (ref 3.5–5.1)
Sodium: 146 mmol/L — ABNORMAL HIGH (ref 135–145)

## 2016-04-13 LAB — MAGNESIUM: Magnesium: 2.9 mg/dL — ABNORMAL HIGH (ref 1.7–2.4)

## 2016-04-13 LAB — TROPONIN I: TROPONIN I: 0.13 ng/mL — AB (ref <0.03)

## 2016-04-14 LAB — TROPONIN I: TROPONIN I: 0.06 ng/mL — AB (ref <0.03)

## 2016-04-15 LAB — BASIC METABOLIC PANEL
ANION GAP: 9 (ref 5–15)
BUN: 122 mg/dL — ABNORMAL HIGH (ref 6–20)
CHLORIDE: 104 mmol/L (ref 101–111)
CO2: 39 mmol/L — AB (ref 22–32)
CREATININE: 1.09 mg/dL — AB (ref 0.44–1.00)
Calcium: 9 mg/dL (ref 8.9–10.3)
GFR calc non Af Amer: 47 mL/min — ABNORMAL LOW (ref >60)
GFR, EST AFRICAN AMERICAN: 54 mL/min — AB (ref >60)
GLUCOSE: 154 mg/dL — AB (ref 65–99)
Potassium: 4.1 mmol/L (ref 3.5–5.1)
Sodium: 152 mmol/L — ABNORMAL HIGH (ref 135–145)

## 2016-04-15 LAB — MAGNESIUM: Magnesium: 2.9 mg/dL — ABNORMAL HIGH (ref 1.7–2.4)

## 2016-04-16 LAB — BASIC METABOLIC PANEL WITH GFR
Anion gap: 11 (ref 5–15)
BUN: 144 mg/dL — ABNORMAL HIGH (ref 6–20)
CO2: 34 mmol/L — ABNORMAL HIGH (ref 22–32)
Calcium: 9 mg/dL (ref 8.9–10.3)
Chloride: 103 mmol/L (ref 101–111)
Creatinine, Ser: 1.48 mg/dL — ABNORMAL HIGH (ref 0.44–1.00)
GFR calc Af Amer: 37 mL/min — ABNORMAL LOW
GFR calc non Af Amer: 32 mL/min — ABNORMAL LOW
Glucose, Bld: 172 mg/dL — ABNORMAL HIGH (ref 65–99)
Potassium: 4.3 mmol/L (ref 3.5–5.1)
Sodium: 148 mmol/L — ABNORMAL HIGH (ref 135–145)

## 2016-04-17 ENCOUNTER — Institutional Professional Consult (permissible substitution) (HOSPITAL_COMMUNITY): Payer: Self-pay

## 2016-04-17 LAB — CBC
HCT: 30.5 % — ABNORMAL LOW (ref 36.0–46.0)
HCT: 32 % — ABNORMAL LOW (ref 36.0–46.0)
Hemoglobin: 7 g/dL — ABNORMAL LOW (ref 12.0–15.0)
Hemoglobin: 9.4 g/dL — ABNORMAL LOW (ref 12.0–15.0)
MCH: 26 pg (ref 26.0–34.0)
MCH: 25.3 pg — ABNORMAL LOW (ref 26.0–34.0)
MCHC: 23 g/dL — AB (ref 30.0–36.0)
MCHC: 29.4 g/dL — ABNORMAL LOW (ref 30.0–36.0)
MCV: 113.4 fL — ABNORMAL HIGH (ref 78.0–100.0)
MCV: 86 fL (ref 78.0–100.0)
PLATELETS: 168 10*3/uL (ref 150–400)
PLATELETS: 195 10*3/uL (ref 150–400)
RBC: 2.69 MIL/uL — ABNORMAL LOW (ref 3.87–5.11)
RBC: 3.72 MIL/uL — ABNORMAL LOW (ref 3.87–5.11)
RDW: 17 % — ABNORMAL HIGH (ref 11.5–15.5)
RDW: 16.2 % — AB (ref 11.5–15.5)
WBC: 5.3 10*3/uL (ref 4.0–10.5)
WBC: 8.6 10*3/uL (ref 4.0–10.5)

## 2016-04-17 LAB — BASIC METABOLIC PANEL
Anion gap: 11 (ref 5–15)
BUN: 139 mg/dL — AB (ref 6–20)
CALCIUM: 8.6 mg/dL — AB (ref 8.9–10.3)
CO2: 34 mmol/L — ABNORMAL HIGH (ref 22–32)
CREATININE: 1.33 mg/dL — AB (ref 0.44–1.00)
Chloride: 96 mmol/L — ABNORMAL LOW (ref 101–111)
GFR calc Af Amer: 43 mL/min — ABNORMAL LOW (ref >60)
GFR, EST NON AFRICAN AMERICAN: 37 mL/min — AB (ref >60)
Glucose, Bld: 183 mg/dL — ABNORMAL HIGH (ref 65–99)
Potassium: 3.8 mmol/L (ref 3.5–5.1)
SODIUM: 141 mmol/L (ref 135–145)

## 2016-04-18 ENCOUNTER — Other Ambulatory Visit (HOSPITAL_COMMUNITY): Payer: Self-pay

## 2016-04-18 LAB — CULTURE, RESPIRATORY W GRAM STAIN

## 2016-04-18 LAB — CULTURE, RESPIRATORY

## 2016-04-24 ENCOUNTER — Other Ambulatory Visit (HOSPITAL_COMMUNITY): Payer: Self-pay

## 2016-04-24 LAB — BASIC METABOLIC PANEL
Anion gap: 7 (ref 5–15)
BUN: 38 mg/dL — AB (ref 6–20)
CHLORIDE: 105 mmol/L (ref 101–111)
CO2: 24 mmol/L (ref 22–32)
CREATININE: 0.98 mg/dL (ref 0.44–1.00)
Calcium: 8.7 mg/dL — ABNORMAL LOW (ref 8.9–10.3)
GFR calc Af Amer: >60 mL/min (ref >60)
GFR calc non Af Amer: 53 mL/min — ABNORMAL LOW (ref >60)
GLUCOSE: 83 mg/dL (ref 65–99)
POTASSIUM: 5.7 mmol/L — AB (ref 3.5–5.1)
Sodium: 136 mmol/L (ref 135–145)

## 2016-04-24 LAB — CBC
HEMATOCRIT: 27.8 % — AB (ref 36.0–46.0)
HEMOGLOBIN: 8.5 g/dL — AB (ref 12.0–15.0)
MCH: 26.2 pg (ref 26.0–34.0)
MCHC: 30.6 g/dL (ref 30.0–36.0)
MCV: 85.8 fL (ref 78.0–100.0)
Platelets: 138 10*3/uL — ABNORMAL LOW (ref 150–400)
RBC: 3.24 MIL/uL — AB (ref 3.87–5.11)
RDW: 18.2 % — ABNORMAL HIGH (ref 11.5–15.5)
WBC: 4.6 10*3/uL (ref 4.0–10.5)

## 2016-04-25 LAB — POTASSIUM: Potassium: 4.2 mmol/L (ref 3.5–5.1)

## 2017-03-03 DEATH — deceased

## 2017-08-10 IMAGING — CR DG ABD PORTABLE 1V
1 series · 1 of 1 positions shown · non-contrast
Comparison: None.

CLINICAL DATA: OG tube placement.

EXAM:
PORTABLE ABDOMEN - 1 VIEW

[AP]
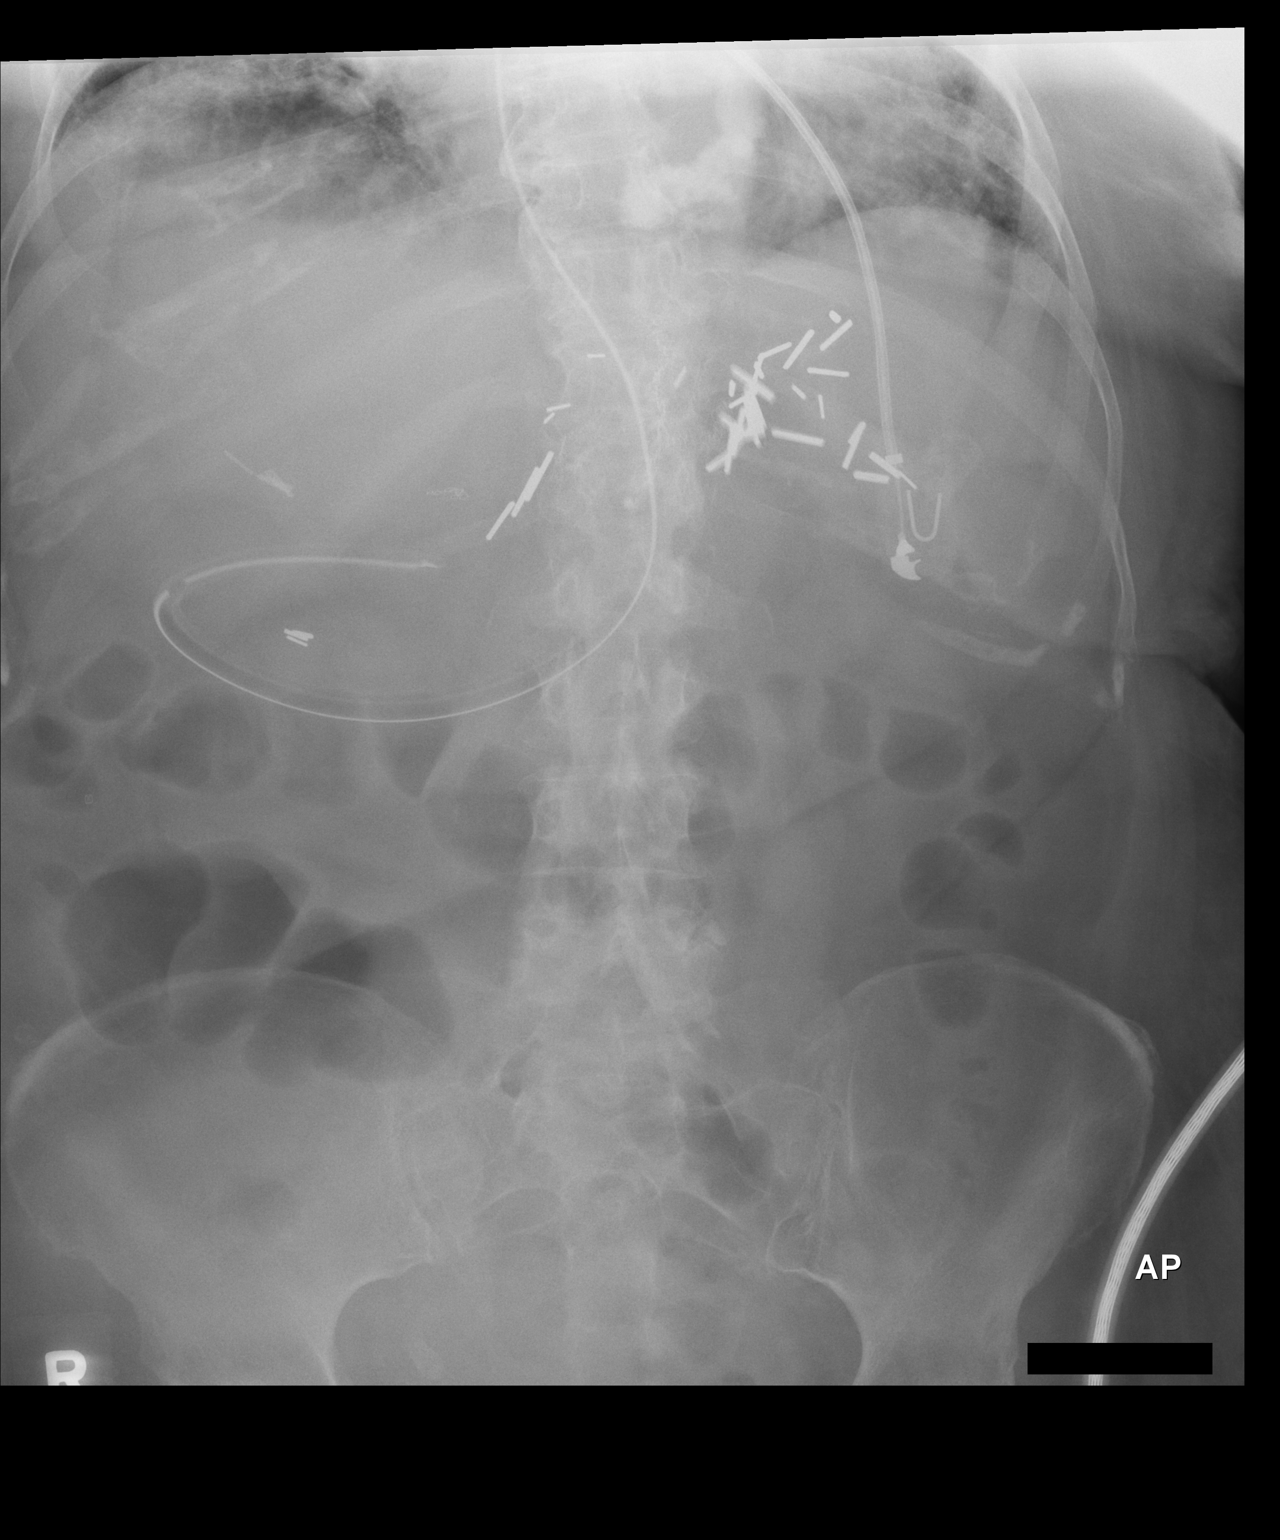

[1 of 1 positions shown; findings below may reference images not displayed]

FINDINGS: OG tube is looped in the distal stomach with the tip projecting
retrograde in the distal body.
IMPRESSION: As above.

## 2017-08-11 IMAGING — CR DG ABD PORTABLE 1V
1 series · 1 of 1 positions shown · non-contrast
Comparison: 03/28/2016 at 0000 hours

CLINICAL DATA: Evaluate NG tube placement.

EXAM:
PORTABLE ABDOMEN - 1 VIEW

[AP]
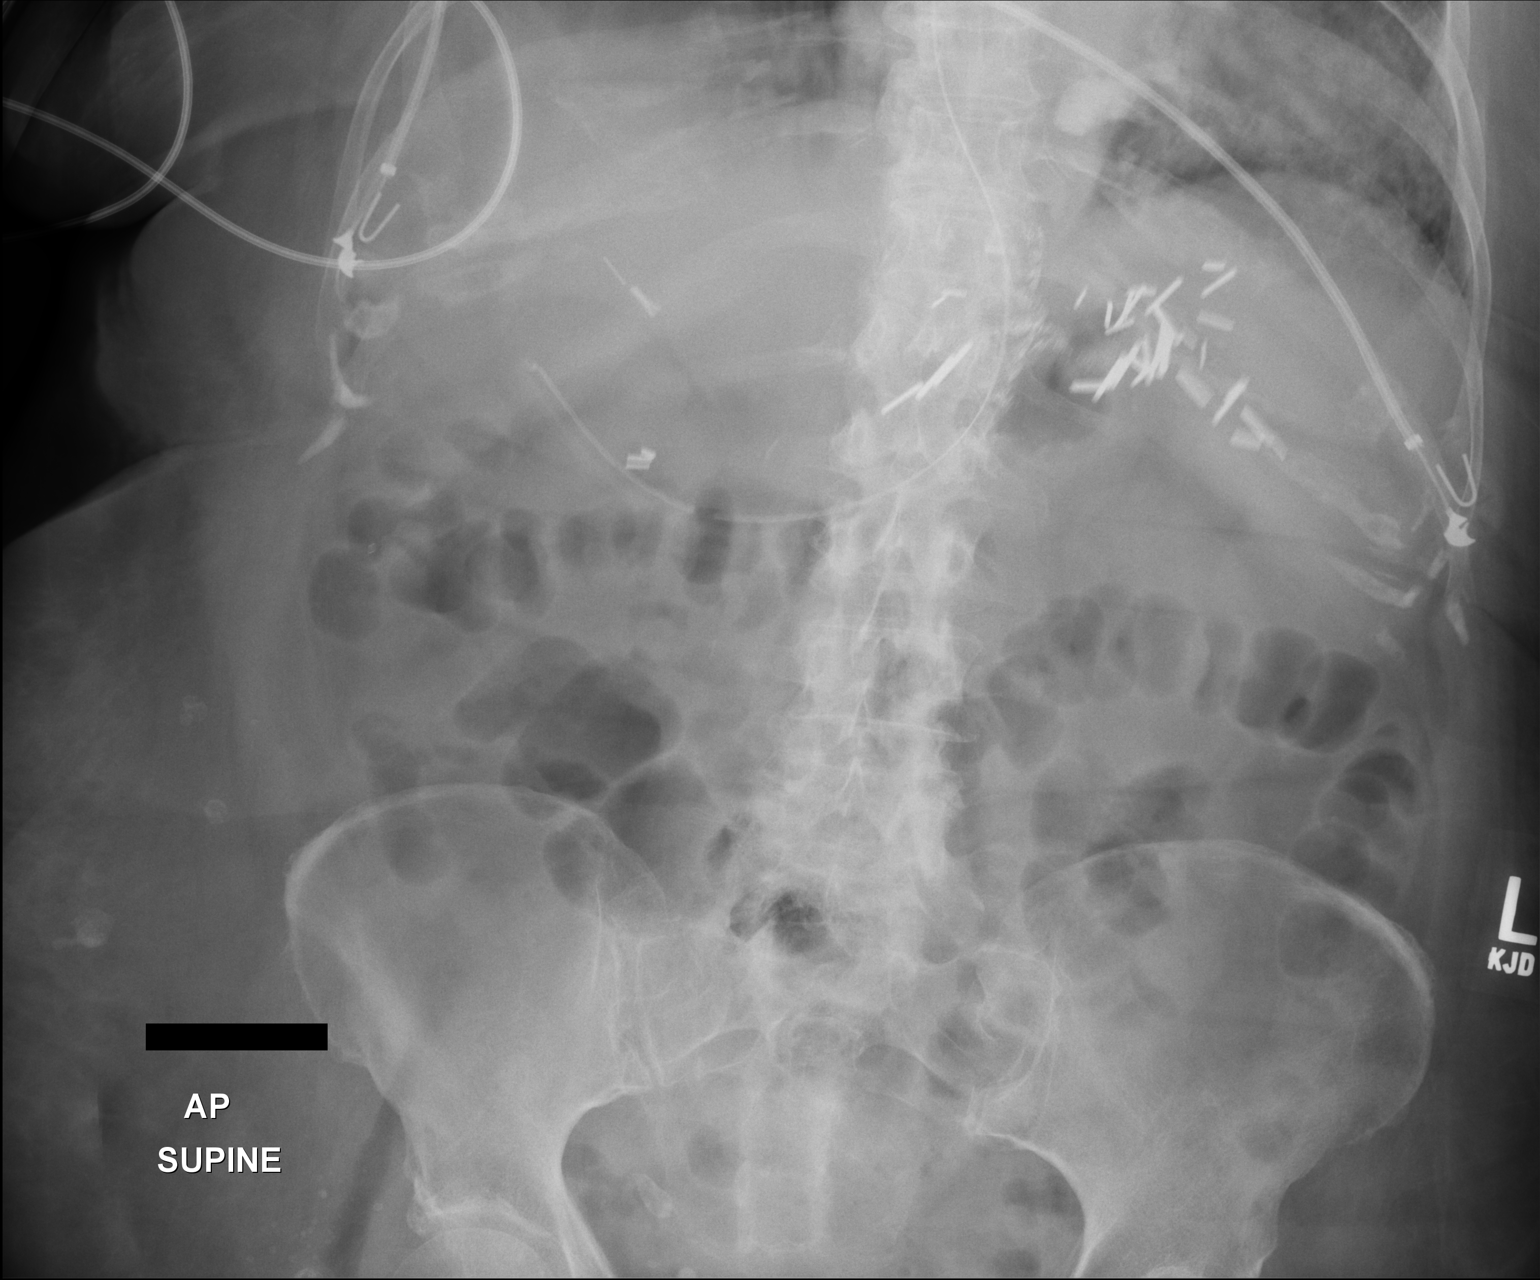

[1 of 1 positions shown; findings below may reference images not displayed]

FINDINGS: Tip of the NG tube projects in the distal stomach or first portion
of the duodenum.

Normal bowel gas pattern.

Multiple surgical vascular clips and anastomosis staples are noted
in the left upper quadrant and epigastrium, stable. Stable changes
from a cholecystectomy.
IMPRESSION: 1. Nasogastric tube tip projects in the distal stomach or first
portion of the duodenum.

## 2017-08-24 IMAGING — DX DG CHEST 1V PORT
1 series · 1 of 1 positions shown · non-contrast
Comparison: 04/06/2016.

CLINICAL DATA: Respiratory failure.

EXAM:
PORTABLE CHEST 1 VIEW

[chest ap]
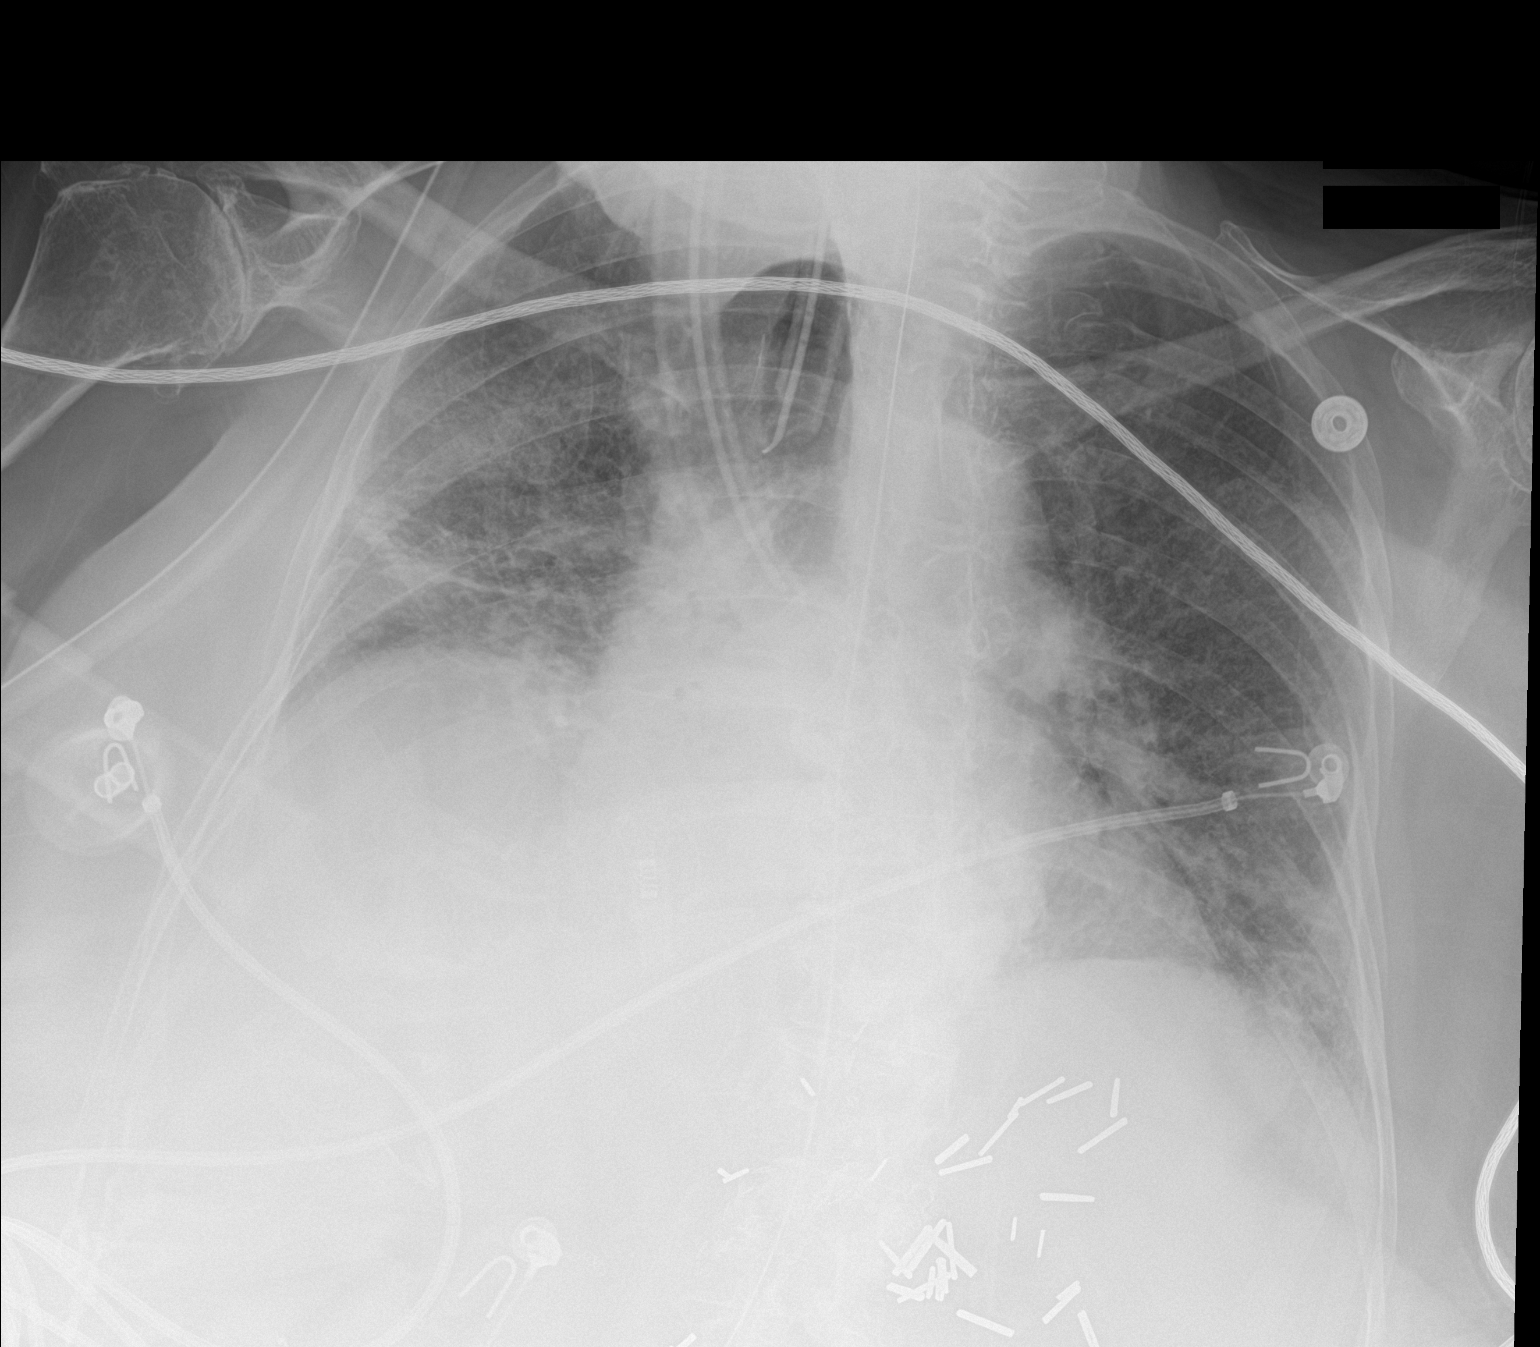

[1 of 1 positions shown; findings below may reference images not displayed]

FINDINGS: Endotracheal tube, NG tube in stable position. Interim removal right
IJ line. Stable cardiomegaly. Progressive right base atelectasis
and/or consolidation . Interim improvement of bilateral pulmonary
infiltrates and or edema with persistent interstitial prominence. No
prominent pleural effusion or pneumothorax.
IMPRESSION: 1. Intimal removal of right IJ line. Endotracheal tube and NG tube
in stable position.

2.  Progressive right lower lobe atelectasis and/or consolidation.

3. Interim improvement of bilateral pulmonary inter
infiltrates/edema. Persistent interstitial prominence remains.

## 2017-08-24 IMAGING — CR DG ABD PORTABLE 1V
1 series · 1 of 1 positions shown · non-contrast
Comparison: 03/29/2016

CLINICAL DATA: Check nasogastric catheter placement

EXAM:
PORTABLE ABDOMEN - 1 VIEW

[AP]
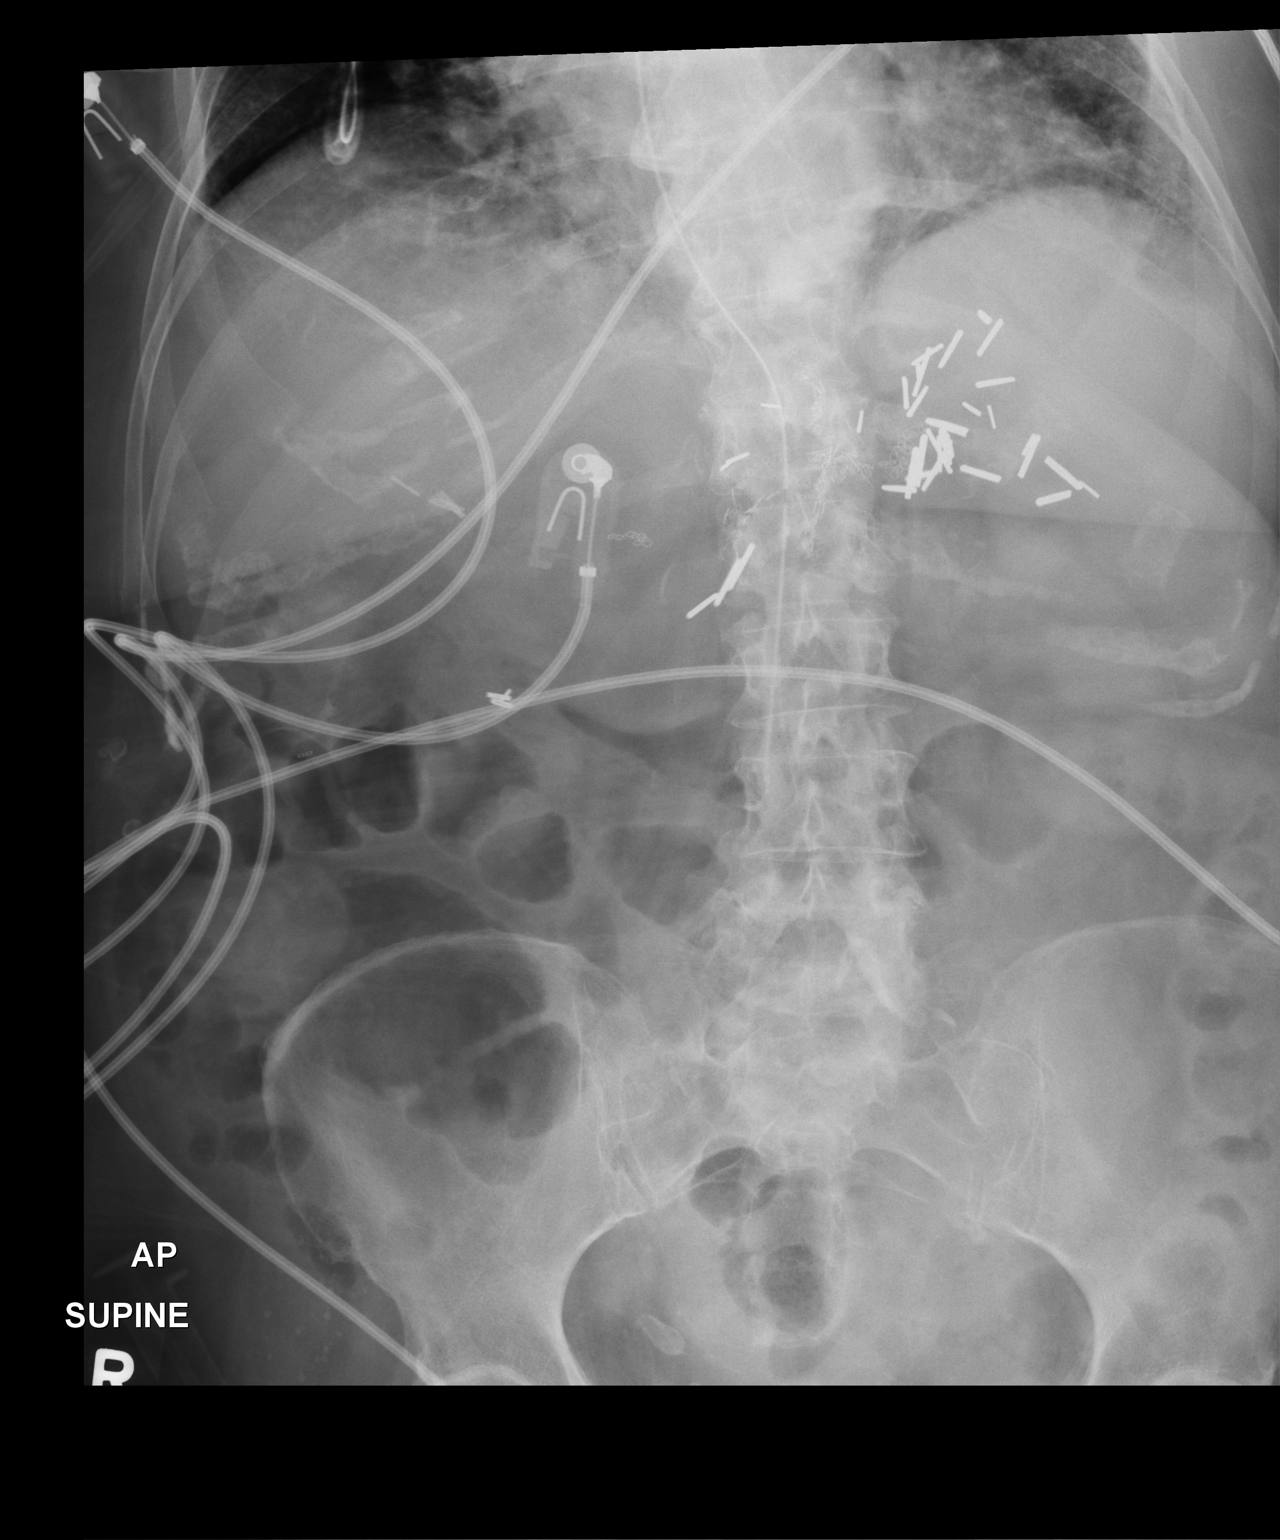

[1 of 1 positions shown; findings below may reference images not displayed]

FINDINGS: Nasogastric catheter is again noted in the midportion of the
stomach. Postsurgical changes are seen. No obstructive changes are
noted. Bony structures are stable.
IMPRESSION: Nasogastric catheter within the stomach.
